# Patient Record
Sex: Male | Born: 1970
Health system: Southern US, Community
[De-identification: ages and names within clinical notes are randomized; demographics above are authoritative.]

## PROBLEM LIST (undated history)

## (undated) DIAGNOSIS — I10 Essential (primary) hypertension: Secondary | ICD-10-CM

## (undated) DIAGNOSIS — K219 Gastro-esophageal reflux disease without esophagitis: Secondary | ICD-10-CM

---

## 2000-04-24 ENCOUNTER — Ambulatory Visit (HOSPITAL_COMMUNITY): Admission: RE | Admit: 2000-04-24 | Discharge: 2000-04-24 | Payer: Self-pay | Admitting: Neurosurgery

## 2000-04-24 ENCOUNTER — Encounter: Payer: Self-pay | Admitting: Neurosurgery

## 2006-03-04 ENCOUNTER — Ambulatory Visit (HOSPITAL_COMMUNITY): Admission: RE | Admit: 2006-03-04 | Discharge: 2006-03-04 | Payer: Self-pay | Admitting: Neurosurgery

## 2006-04-08 ENCOUNTER — Encounter: Admission: RE | Admit: 2006-04-08 | Discharge: 2006-04-08 | Payer: Self-pay | Admitting: Neurosurgery

## 2006-05-20 ENCOUNTER — Encounter: Admission: RE | Admit: 2006-05-20 | Discharge: 2006-05-20 | Payer: Self-pay | Admitting: Neurosurgery

## 2006-07-19 ENCOUNTER — Encounter: Admission: RE | Admit: 2006-07-19 | Discharge: 2006-07-19 | Payer: Self-pay | Admitting: Neurosurgery

## 2007-03-17 IMAGING — CR DG CERVICAL SPINE 1V
1 series · 1 of 1 positions shown · non-contrast
Comparison: 03/04/06.

CLINICAL DATA: Anterior fusion.  Neck pain. 
 CERVICAL SPINE ONE VIEW:

[w c-spine lat]
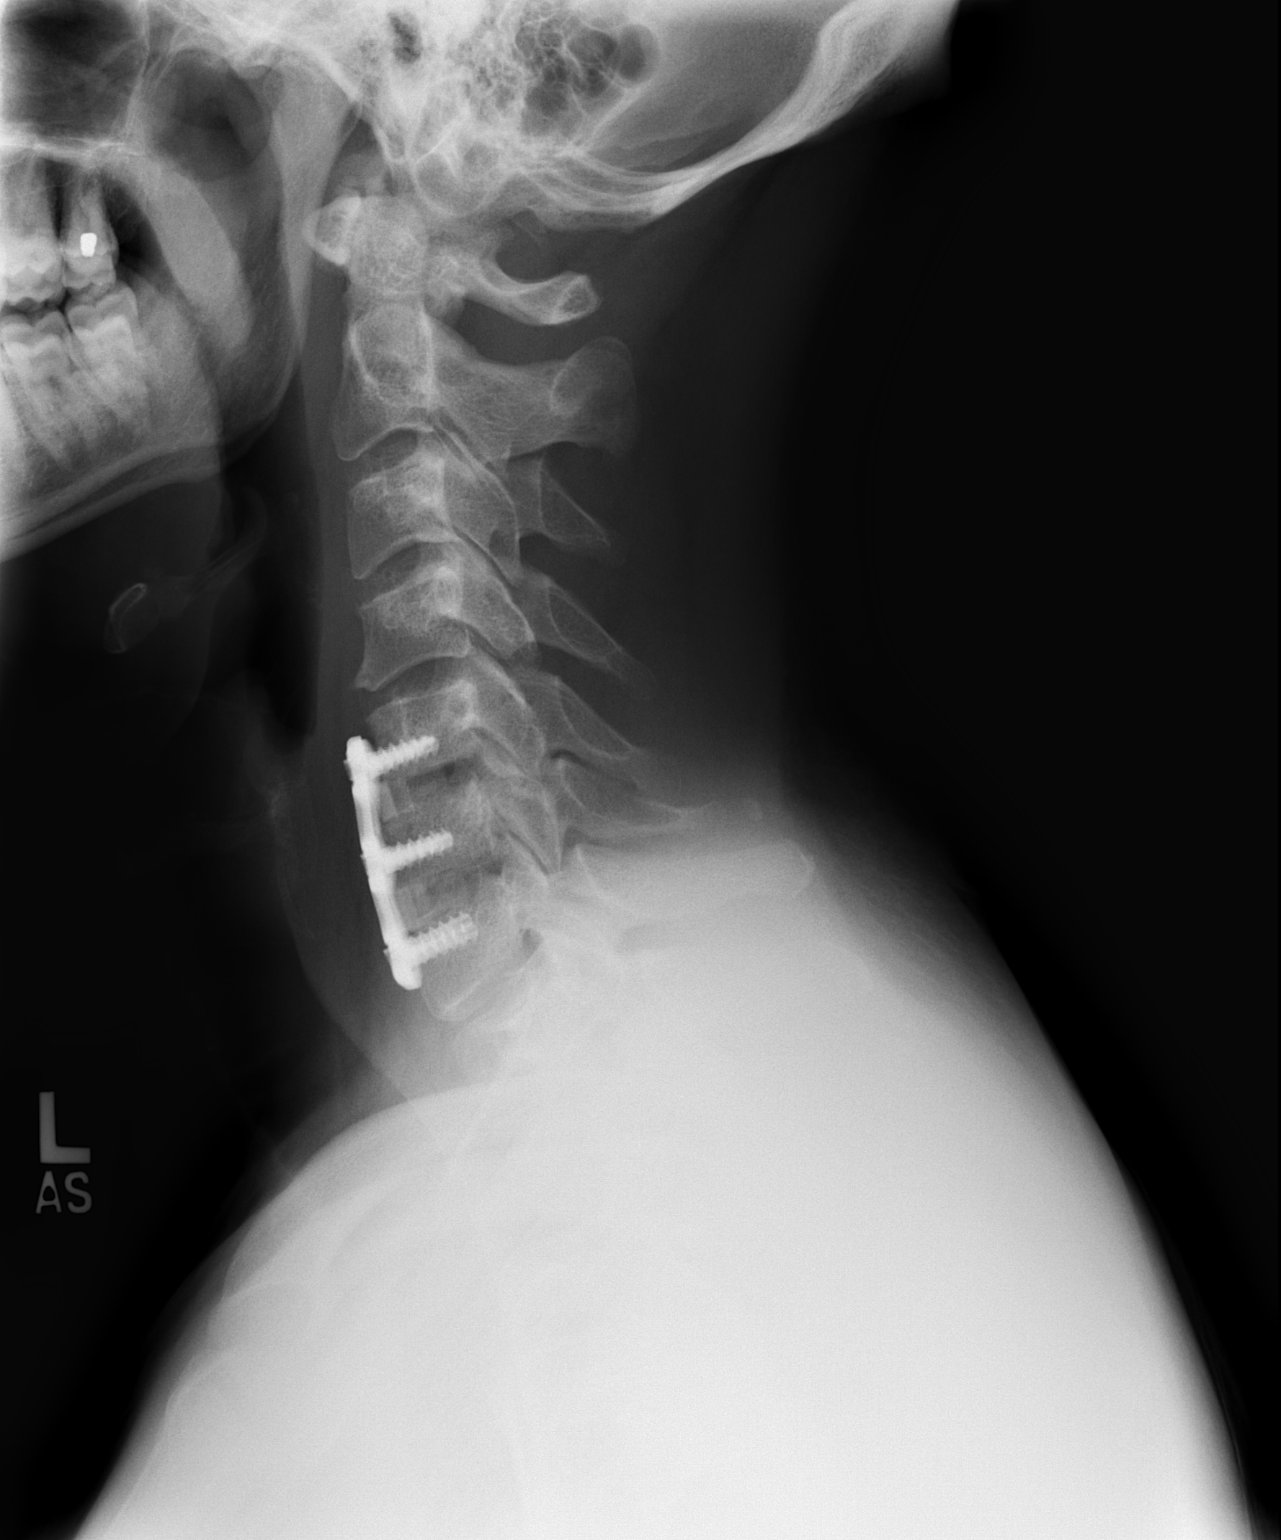

[1 of 1 positions shown; findings below may reference images not displayed]

Lateral view of the cervical spine is compared to an intraoperative film.  Anterior fusion appears stable at C5-6 and C6-7 levels.  The interbody fusion plugs are unchanged in position and in height and the anterior metallic fixation plate remains with normal alignment present.  No significant prevertebral soft tissue swelling is seen.
IMPRESSION: Stable appearance of anterior fusion at C5-6 and C6-7.

## 2007-04-28 IMAGING — CR DG CERVICAL SPINE 1V
1 series · 1 of 1 positions shown · non-contrast
Comparison: 04/08/06.

CLINICAL DATA: Cervical fusion Sunday October, 2005.  Some soreness in shoulders. 
 CERVICAL SPINE ONE VIEW:

[view not recorded]
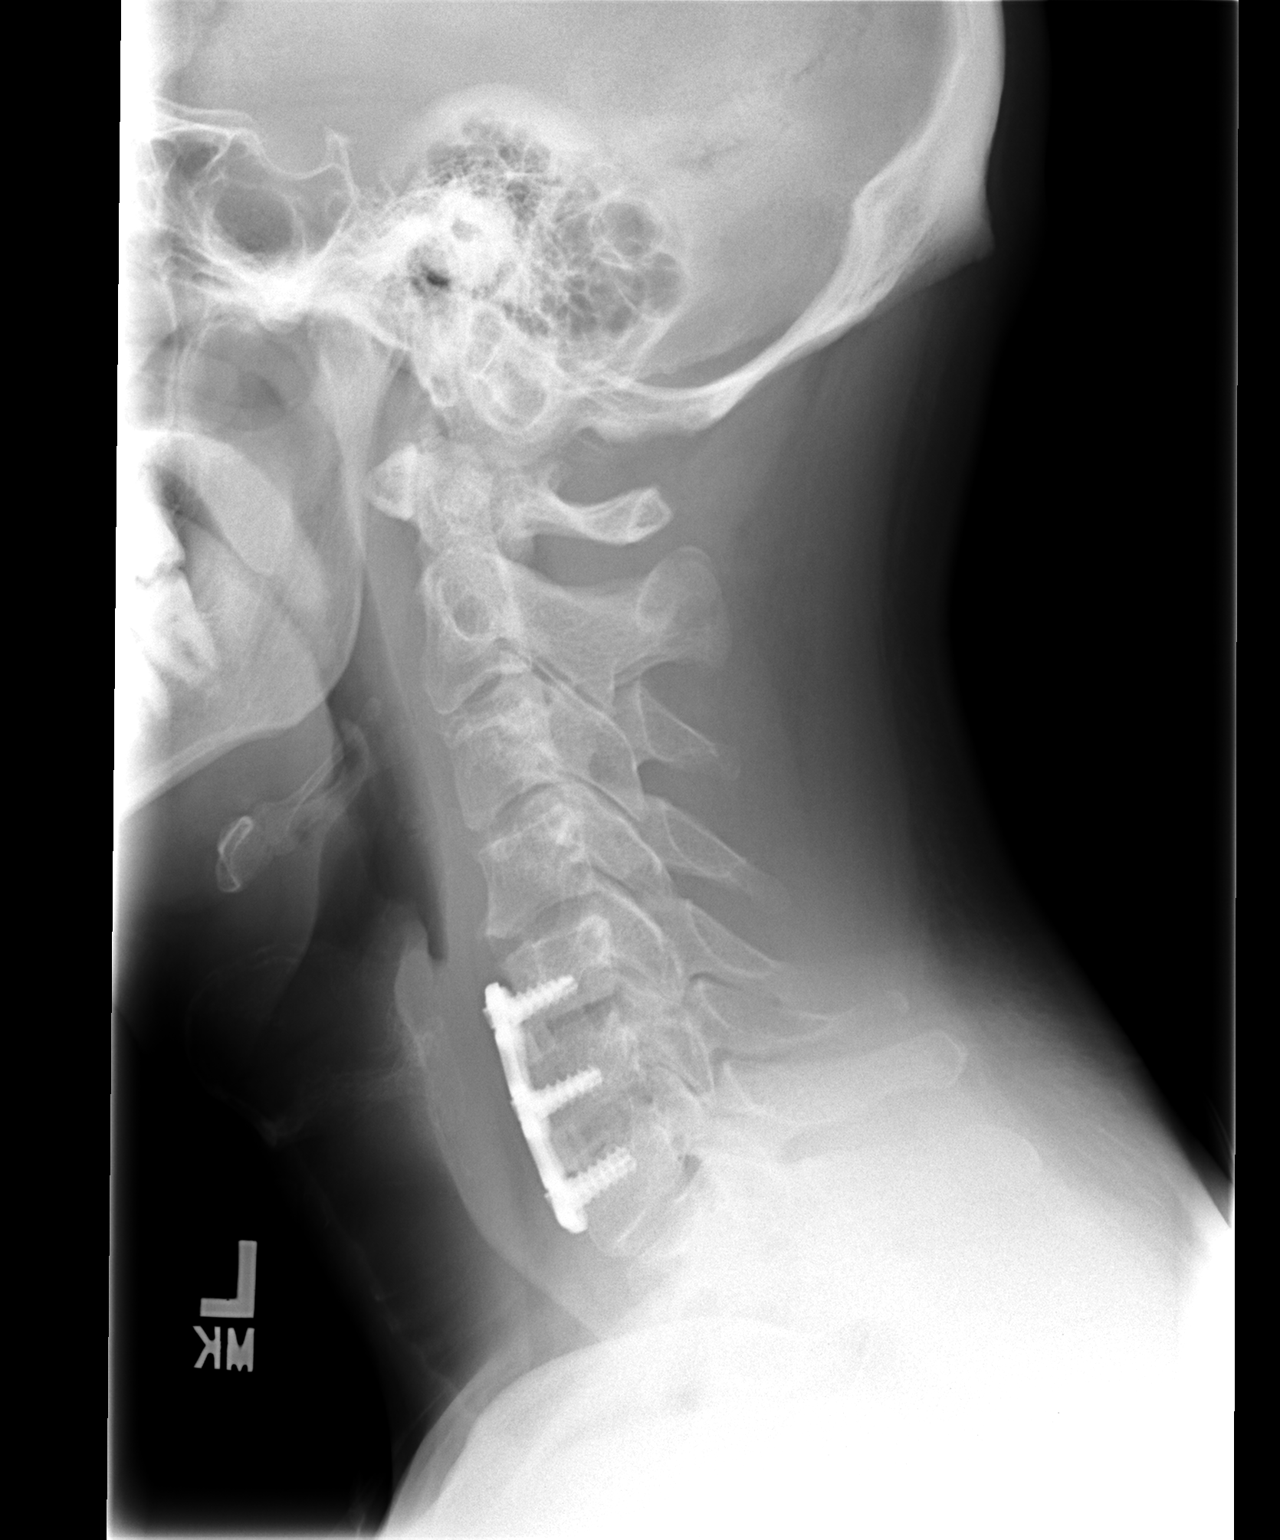

[1 of 1 positions shown; findings below may reference images not displayed]

A single lateral view of the cervical spine shows stable anterior fusion at C5-6 and C6-7.  Interbody fusion plugs are unchanged in position and in height and the anterior metallic fixation plate is unchanged.  No change in alignment is seen.
IMPRESSION: Stable anterior fusion at C5-6 and C6-7.

## 2014-12-29 ENCOUNTER — Other Ambulatory Visit: Payer: Self-pay | Admitting: Family Medicine

## 2014-12-30 ENCOUNTER — Other Ambulatory Visit: Payer: Self-pay | Admitting: Family Medicine

## 2014-12-30 DIAGNOSIS — R1310 Dysphagia, unspecified: Secondary | ICD-10-CM

## 2015-01-05 ENCOUNTER — Ambulatory Visit
Admission: RE | Admit: 2015-01-05 | Discharge: 2015-01-05 | Disposition: A | Discharge status: Home / Self Care | Payer: BLUE CROSS/BLUE SHIELD | Source: Ambulatory Visit | Attending: Family Medicine | Admitting: Family Medicine

## 2015-01-05 DIAGNOSIS — R1310 Dysphagia, unspecified: Secondary | ICD-10-CM

## 2015-07-20 DIAGNOSIS — I10 Essential (primary) hypertension: Secondary | ICD-10-CM | POA: Diagnosis not present

## 2016-01-07 ENCOUNTER — Ambulatory Visit (HOSPITAL_COMMUNITY)
Admission: EM | Admit: 2016-01-07 | Discharge: 2016-01-07 | Disposition: A | Discharge status: Home / Self Care | Payer: BLUE CROSS/BLUE SHIELD | Attending: Emergency Medicine | Admitting: Emergency Medicine

## 2016-01-07 ENCOUNTER — Encounter (HOSPITAL_COMMUNITY): Payer: Self-pay | Admitting: Emergency Medicine

## 2016-01-07 ENCOUNTER — Encounter (HOSPITAL_COMMUNITY)
Admission: EM | Disposition: A | Discharge status: Home / Self Care | Payer: Self-pay | Source: Home / Self Care | Attending: Emergency Medicine

## 2016-01-07 DIAGNOSIS — I10 Essential (primary) hypertension: Secondary | ICD-10-CM | POA: Insufficient documentation

## 2016-01-07 DIAGNOSIS — K222 Esophageal obstruction: Secondary | ICD-10-CM | POA: Insufficient documentation

## 2016-01-07 DIAGNOSIS — T18128A Food in esophagus causing other injury, initial encounter: Secondary | ICD-10-CM

## 2016-01-07 HISTORY — PX: ESOPHAGOGASTRODUODENOSCOPY: SHX5428

## 2016-01-07 LAB — COMPREHENSIVE METABOLIC PANEL
ALBUMIN: 5 g/dL (ref 3.5–5.0)
ALK PHOS: 64 U/L (ref 38–126)
ALT: 37 U/L (ref 17–63)
AST: 43 U/L — ABNORMAL HIGH (ref 15–41)
Anion gap: 12 (ref 5–15)
BUN: 19 mg/dL (ref 6–20)
CALCIUM: 9.7 mg/dL (ref 8.9–10.3)
CO2: 23 mmol/L (ref 22–32)
CREATININE: 0.99 mg/dL (ref 0.61–1.24)
Chloride: 107 mmol/L (ref 101–111)
GFR calc Af Amer: >60 mL/min (ref >60)
GFR calc non Af Amer: >60 mL/min (ref >60)
GLUCOSE: 110 mg/dL — AB (ref 65–99)
Potassium: 3.7 mmol/L (ref 3.5–5.1)
SODIUM: 142 mmol/L (ref 135–145)
Total Bilirubin: 1.5 mg/dL — ABNORMAL HIGH (ref 0.3–1.2)
Total Protein: 8.3 g/dL — ABNORMAL HIGH (ref 6.5–8.1)

## 2016-01-07 LAB — CBC
HCT: 42.9 % (ref 39.0–52.0)
Hemoglobin: 14.2 g/dL (ref 13.0–17.0)
MCH: 31.1 pg (ref 26.0–34.0)
MCHC: 33.1 g/dL (ref 30.0–36.0)
MCV: 93.9 fL (ref 78.0–100.0)
PLATELETS: 197 10*3/uL (ref 150–400)
RBC: 4.57 MIL/uL (ref 4.22–5.81)
RDW: 13.1 % (ref 11.5–15.5)
WBC: 7 10*3/uL (ref 4.0–10.5)

## 2016-01-07 LAB — URINALYSIS, ROUTINE W REFLEX MICROSCOPIC
Glucose, UA: NEGATIVE mg/dL
HGB URINE DIPSTICK: NEGATIVE
Ketones, ur: 15 mg/dL — AB
Leukocytes, UA: NEGATIVE
Nitrite: NEGATIVE
PROTEIN: 30 mg/dL — AB
SPECIFIC GRAVITY, URINE: 1.036 — AB (ref 1.005–1.030)
pH: 6.5 (ref 5.0–8.0)

## 2016-01-07 LAB — URINE MICROSCOPIC-ADD ON: SQUAMOUS EPITHELIAL / LPF: NONE SEEN

## 2016-01-07 LAB — LIPASE, BLOOD: Lipase: 28 U/L (ref 11–51)

## 2016-01-07 SURGERY — EGD (ESOPHAGOGASTRODUODENOSCOPY)
Anesthesia: Moderate Sedation

## 2016-01-07 MED ORDER — FENTANYL CITRATE (PF) 100 MCG/2ML IJ SOLN
INTRAMUSCULAR | Status: DC | PRN
  Administered 2016-01-07 (×3): 25 ug via INTRAVENOUS

## 2016-01-07 MED ORDER — BUTAMBEN-TETRACAINE-BENZOCAINE 2-2-14 % EX AERO
INHALATION_SPRAY | CUTANEOUS | Status: DC | PRN
  Administered 2016-01-07: 2 via TOPICAL

## 2016-01-07 MED ORDER — DIPHENHYDRAMINE HCL 50 MG/ML IJ SOLN
INTRAMUSCULAR | Status: AC
  Filled 2016-01-07: qty 1

## 2016-01-07 MED ORDER — FENTANYL CITRATE (PF) 100 MCG/2ML IJ SOLN
INTRAMUSCULAR | Status: AC
  Filled 2016-01-07: qty 4

## 2016-01-07 MED ORDER — MIDAZOLAM HCL 5 MG/ML IJ SOLN
INTRAMUSCULAR | Status: AC
  Filled 2016-01-07: qty 2

## 2016-01-07 MED ORDER — MIDAZOLAM HCL 10 MG/2ML IJ SOLN
INTRAMUSCULAR | Status: DC | PRN
  Administered 2016-01-07: 1 mg via INTRAVENOUS
  Administered 2016-01-07 (×3): 2 mg via INTRAVENOUS

## 2016-01-07 MED ORDER — DIPHENHYDRAMINE HCL 50 MG/ML IJ SOLN
INTRAMUSCULAR | Status: DC | PRN
  Administered 2016-01-07 (×2): 25 mg via INTRAVENOUS

## 2016-01-07 MED ORDER — ONDANSETRON HCL 4 MG/2ML IJ SOLN
4.0000 mg | Freq: Once | INTRAMUSCULAR | Status: AC | PRN
  Administered 2016-01-07: 4 mg via INTRAVENOUS
  Filled 2016-01-07: qty 2

## 2016-01-07 NOTE — ED Provider Notes (Signed)
[facility]-EMERGENCY DEPT Provider Note   CSN: [id number] Arrival date & time: 01/07/16  1240     History   Chief Complaint Chief Complaint  Patient presents with  . Emesis  . Sore Throat    HPI Dakota Fields is a 45 y.o. male.  Patient is a 45 year old male with history of hypertension who presents the ED with complaint of vomiting. Patient reports after eating dinner last night he began to feel that something was stuck in his throat. He reports eating an eggroll with spicy sauce and dry chicken. He notes he started vomiting around 10 PM last night. He states he is able to vomit a small amount of food but states he still feels that something is stuck in his throat. He notes he has been unable to tolerate PO since onset. He reports his throat feels sore from vomiting and notes he has a mild headache yesterday which has since resolved. Pt states he will belch intermittently which will mildly relieved his discomfort but reports it then returned and he feels like he needs to vomit but is unable to. Denies fever, chills, difficulty breathing, chest pain, hematemesis, abdominal pain, nausea. Denies history of similar symptoms.      History reviewed. No pertinent past medical history.  There are no active problems to display for this patient.   History reviewed. No pertinent surgical history.     Home Medications    Prior to Admission medications   Medication Sig Start Date End Date Taking? Authorizing Provider  lisinopril (PRINIVIL,ZESTRIL) 20 MG tablet Take 20 mg by mouth daily.   Yes Historical Provider, MD  loratadine (CLARITIN) 10 MG tablet Take 10 mg by mouth daily.   Yes Historical Provider, MD  metoprolol succinate (TOPROL-XL) 25 MG 24 hr tablet Take 25 mg by mouth daily.   Yes Historical Provider, MD  Multiple Vitamin (MULTIVITAMIN WITH MINERALS) TABS tablet Take 1 tablet by mouth daily.   Yes Historical Provider, MD  omega-3 acid ethyl esters (LOVAZA) 1 g capsule Take 1  g by mouth 2 (two) times daily.   Yes Historical Provider, MD    Family History History reviewed. No pertinent family history.  Social History Social History  Substance Use Topics  . Smoking status: Never Smoker  . Smokeless tobacco: Not on file  . Alcohol use No     Comment: 3-4 times per week     Allergies   Review of patient's allergies indicates no known allergies.   Review of Systems Review of Systems  HENT: Positive for sore throat and trouble swallowing.   Gastrointestinal: Positive for vomiting.  Neurological: Positive for headaches.  All other systems reviewed and are negative.    Physical Exam Updated Vital Signs BP (!) 164/107 (BP Location: Right Arm)   Pulse 101   Temp 98.4 F (36.9 C) (Oral)   Resp 16   SpO2 100%   Physical Exam  Constitutional: He is oriented to person, place, and time. He appears well-developed and well-nourished. No distress.  Pt dry heaving and spitting into a emesis bag throughout exam.   HENT:  Head: Normocephalic and atraumatic.  Mouth/Throat: Uvula is midline, oropharynx is clear and moist and mucous membranes are normal. No oropharyngeal exudate, posterior oropharyngeal edema, posterior oropharyngeal erythema or tonsillar abscesses. No tonsillar exudate.  Eyes: Conjunctivae and EOM are normal. Right eye exhibits no discharge. Left eye exhibits no discharge. No scleral icterus.  Neck: Normal range of motion. Neck supple.  Cardiovascular: Normal rate, regular  rhythm, normal heart sounds and intact distal pulses.   Pulmonary/Chest: Effort normal and breath sounds normal. No respiratory distress. He has no wheezes. He has no rales. He exhibits no tenderness.  Abdominal: Soft. Bowel sounds are normal. He exhibits no distension and no mass. There is no tenderness. There is no rebound and no guarding.  Musculoskeletal: He exhibits no edema.  Neurological: He is alert and oriented to person, place, and time.  Skin: Skin is warm and  dry. He is not diaphoretic.  Nursing note and vitals reviewed.    ED Treatments / Results  Labs (all labs ordered are listed, but only abnormal results are displayed) Labs Reviewed  COMPREHENSIVE METABOLIC PANEL - Abnormal; Notable for the following:       Result Value   Glucose, Bld 110 (*)    Total Protein 8.3 (*)    AST 43 (*)    Total Bilirubin 1.5 (*)    All other components within normal limits  URINALYSIS, ROUTINE W REFLEX MICROSCOPIC (NOT AT [facility]) - Abnormal; Notable for the following:    Color, Urine AMBER (*)    Specific Gravity, Urine 1.036 (*)    Bilirubin Urine SMALL (*)    Ketones, ur 15 (*)    Protein, ur 30 (*)    All other components within normal limits  URINE MICROSCOPIC-ADD ON - Abnormal; Notable for the following:    Bacteria, UA RARE (*)    All other components within normal limits  LIPASE, BLOOD  CBC    EKG  EKG Interpretation None       Radiology No results found.  Procedures Procedures (including critical care time)  Medications Ordered in ED Medications  ondansetron (ZOFRAN) injection 4 mg (4 mg Intravenous Given 01/07/16 1500)     Initial Impression / Assessment and Plan / ED Course  I have reviewed the triage vital signs and the nursing notes.  Pertinent labs & imaging results that were available during my care of the patient were reviewed by me and considered in my medical decision making (see chart for details).  Clinical Course    Patient presents with vomiting. He reports after eating dinner last night he felt that food was stuck in his throat and notes he has not been able to tolerate PO since onset. VSS. On exam patient does not appear to be in distress. Lungs CTAB. Pt spitting into emesis bag, unable to tolerate secretions/PO. Consulted GI due to concern for food impaction. Dr. Madilyn FiremanHayes reports he will come evaluate the pt in the ED. Plan to perform endoscopy and d/c home s/p procedure.   Final Clinical Impressions(s) / ED  Diagnoses   Final diagnoses:  Food impaction of esophagus, initial encounter    New Prescriptions New Prescriptions   No medications on file     Barrett Henleicole Elizabeth Jaunice Mirza, PA-C 01/07/16 1633    Canary Brimhristopher J Tegeler, MD 01/08/16 1039

## 2016-01-07 NOTE — Op Note (Signed)
[facility] Patient Name: Dakota LacyChadwick Gaetz Procedure Date: 01/07/2016 MRN: [id number] Attending MD: Barrie FolkJohn C Jeralynn Vaquera , MD Date of Birth: [birth year] CSN: [id number] Age: [age] Admit Type: Outpatient Procedure:                Upper GI endoscopy Indications:              Foreign body in the esophagus Providers:                Everardo AllJohn C. Madilyn FiremanHayes, MD, Tomma RakersJennifer Kappus, RN, Oletha Blendavida                            Shoffner, Technician Referring MD:              Medicines:                Midazolam 7 mg IV, Fentanyl 75 micrograms IV,                            Diphenhydramine 25 mg IV Complications:            No immediate complications. Estimated Blood Loss:     Estimated blood loss: none. Procedure:                Pre-Anesthesia Assessment:                           - Prior to the procedure, a History and Physical                            was performed, and patient medications and                            allergies were reviewed. The patient's tolerance of                            previous anesthesia was also reviewed. The risks                            and benefits of the procedure and the sedation                            options and risks were discussed with the patient.                            All questions were answered, and informed consent                            was obtained. Prior Anticoagulants: The patient has                            taken no previous anticoagulant or antiplatelet                            agents. ASA Grade Assessment: II - A patient with  mild systemic disease. After reviewing the risks                            and benefits, the patient was deemed in                            satisfactory condition to undergo the procedure.                           After obtaining informed consent, the endoscope was                            passed under direct vision. Throughout the                            procedure, the  patient's blood pressure, pulse, and                            oxygen saturations were monitored continuously. The                            [id number]) scope was introduced through the                            mouth, and advanced to the second part of duodenum.                            The upper GI endoscopy was accomplished without                            difficulty. The patient tolerated the procedure                            well. Findings:      Food was found in the distal esophagus. Removal of food was accomplished.      A low-grade of narrowing Schatzki ring (acquired) was found at the       gastroesophageal junction.      The exam was otherwise without abnormality. Impression:               - Food in the distal esophagus. Removal was                            successful.                           - Low-grade of narrowing Schatzki ring.                           - The examination was otherwise normal. Moderate Sedation:      Moderate (conscious) sedation was administered by the endoscopy nurse       and supervised by the endoscopist. The following parameters were       monitored: oxygen saturation, heart rate, blood pressure, and response       to care. [Procedure Duration Time]. Recommendation:           -  Discharge patient to home (ambulatory).                           - Use Prilosec OTC 20 mg PO daily.                           - Return to my office in 4 weeks.                           - Soft diet.                           - Continue present medications. Procedure Code(s):        --- Professional ---                           [phone], Esophagogastroduodenoscopy, flexible,                            transoral; with removal of foreign body(s) Diagnosis Code(s):        --- Professional ---                           V78.469G, Food in esophagus causing other injury,                            initial encounter                           K22.2, Esophageal  obstruction                           T18.108A, Unspecified foreign body in esophagus                            causing other injury, initial encounter CPT copyright 2016 American Medical Association. All rights reserved. The codes documented in this report are preliminary and upon coder review may  be revised to meet current compliance requirements. Barrie Folk, MD 01/07/2016 5:31:36 PM This report has been signed electronically. Number of Addenda: 0

## 2016-01-07 NOTE — Discharge Instructions (Signed)
YOU HAD AN ENDOSCOPIC PROCEDURE TODAY: Refer to the procedure report and other information in the discharge instructions given to you for any specific questions about what was found during the examination. If this information does not answer your questions, please call [facility] office at [phone] to clarify.   YOU SHOULD EXPECT: Some feelings of bloating in the abdomen. Passage of more gas than usual. Walking can help get rid of the air that was put into your GI tract during the procedure and reduce the bloating. If you had a lower endoscopy (such as a colonoscopy or flexible sigmoidoscopy) you may notice spotting of blood in your stool or on the toilet paper. Some abdominal soreness may be present for a day or two, also.  DIET: Your first meal following the procedure should be a light meal and then it is ok to progress to your normal diet. A half-sandwich or bowl of soup is an example of a good first meal. Heavy or fried foods are harder to digest and may make you feel nauseous or bloated. Drink plenty of fluids but you should avoid alcoholic beverages for 24 hours. If you had a esophageal dilation, please see attached instructions for diet.   ACTIVITY: Your care partner should take you home directly after the procedure. You should plan to take it easy, moving slowly for the rest of the day. You can resume normal activity the day after the procedure however YOU SHOULD NOT DRIVE, use power tools, machinery or perform tasks that involve climbing or major physical exertion for 24 hours (because of the sedation medicines used during the test).   SYMPTOMS TO REPORT IMMEDIATELY: A gastroenterologist can be reached at any hour. Please call [phone]  for any of the following symptoms:  Following lower endoscopy (colonoscopy, flexible sigmoidoscopy) Excessive amounts of blood in the stool  Significant tenderness, worsening of abdominal pains  Swelling of the abdomen that is new, acute  Fever of 100 or  higher  Following upper endoscopy (EGD, EUS, ERCP, esophageal dilation) Vomiting of blood or coffee ground material  New, significant abdominal pain  New, significant chest pain or pain under the shoulder blades  Painful or persistently difficult swallowing  New shortness of breath  Black, tarry-looking or red, bloody stools  FOLLOW UP:  If any biopsies were taken you will be contacted by phone or by letter within the next 1-3 weeks. Call [phone]  if you have not heard about the biopsies in 3 weeks.  Please also call with any specific questions about appointments or follow up tests. Moderate Conscious Sedation, Adult, Care After Refer to this sheet in the next few weeks. These instructions provide you with information on caring for yourself after your procedure. Your health care provider may also give you more specific instructions. Your treatment has been planned according to current medical practices, but problems sometimes occur. Call your health care provider if you have any problems or questions after your procedure. WHAT TO EXPECT AFTER THE PROCEDURE  After your procedure:  You may feel sleepy, clumsy, and have poor balance for several hours.  Vomiting may occur if you eat too soon after the procedure. HOME CARE INSTRUCTIONS  Do not participate in any activities where you could become injured for at least 24 hours. Do not:  Drive.  Swim.  Ride a bicycle.  Operate heavy machinery.  Cook.  Use power tools.  Climb ladders.  Work from a high place.  Do not make important decisions or sign legal  documents until you are improved.  If you vomit, drink water, juice, or soup when you can drink without vomiting. Make sure you have little or no nausea before eating solid foods.  Only take over-the-counter or prescription medicines for pain, discomfort, or fever as directed by your health care provider.  Make sure you and your family fully understand everything about the  medicines given to you, including what side effects may occur.  You should not drink alcohol, take sleeping pills, or take medicines that cause drowsiness for at least 24 hours.  If you smoke, do not smoke without supervision.  If you are feeling better, you may resume normal activities 24 hours after you were sedated.  Keep all appointments with your health care provider. SEEK MEDICAL CARE IF:  Your skin is pale or bluish in color.  You continue to feel nauseous or vomit.  Your pain is getting worse and is not helped by medicine.  You have bleeding or swelling.  You are still sleepy or feeling clumsy after 24 hours. SEEK IMMEDIATE MEDICAL CARE IF:  You develop a rash.  You have difficulty breathing.  You develop any type of allergic problem.  You have a fever. MAKE SURE YOU:  Understand these instructions.  Will watch your condition.  Will get help right away if you are not doing well or get worse.   This information is not intended to replace advice given to you by your health care provider. Make sure you discuss any questions you have with your health care provider.   Document Released: 01/06/2013 Document Revised: 04/08/2014 Document Reviewed: 01/06/2013 Elsevier Interactive Patient Education [employer].

## 2016-01-07 NOTE — H&P (Signed)
[facility] Admission History & Physical  Chief Complaint: Food stuck in esophagus HPI: Dakota Fields is an 45 y.o. white male.  With history of intermittent solid food dysphagia 8 several bites of chicken last night is felt that he could not clear the bolus beyond his esophagus. He's been name and able to hold down liquids every since. He has had similar milder episodes but never requiring treatment. He denies chest pain is breathing fine.  History reviewed. No pertinent past medical history.  History reviewed. No pertinent surgical history.  Medications Prior to Admission  Medication Sig Dispense Refill  . lisinopril (PRINIVIL,ZESTRIL) 20 MG tablet Take 20 mg by mouth daily.    Marland Kitchen loratadine (CLARITIN) 10 MG tablet Take 10 mg by mouth daily.    . metoprolol succinate (TOPROL-XL) 25 MG 24 hr tablet Take 25 mg by mouth daily.    . Multiple Vitamin (MULTIVITAMIN WITH MINERALS) TABS tablet Take 1 tablet by mouth daily.    Marland Kitchen omega-3 acid ethyl esters (LOVAZA) 1 g capsule Take 1 g by mouth 2 (two) times daily.      Allergies: No Known Allergies  History reviewed. No pertinent family history.  Social History:  reports that he has never smoked. He does not have any smokeless tobacco history on file. He reports that he does not drink alcohol or use drugs.  Review of Systems: negative except As above   Blood pressure (!) 165/102, pulse 97, temperature 98.4 F (36.9 C), temperature source Oral, resp. rate (!) 28, height _0  (1.803 m), weight 84.8 kg (187 lb), SpO2 100 %. Head: Normocephalic, without obvious abnormality, atraumatic Neck: no adenopathy, no carotid bruit, no JVD, supple, symmetrical, trachea midline and thyroid not enlarged, symmetric, no tenderness/mass/nodules Resp: clear to auscultation bilaterally Cardio: regular rate and rhythm, S1, S2 normal, no murmur, click, rub or gallop GI: Abdomen soft nondistended with normoactive bowel sounds. No Megaly Masses  or Guarding. Extremities: extremities normal, atraumatic, no cyanosis or edema  Results for orders placed or performed during the hospital encounter of 01/07/16 (from the past 48 hour(s))  Lipase, blood     Status: None   Collection Time: 01/07/16  1:01 PM  Result Value Ref Range   Lipase 28 11 - 51 U/L  Comprehensive metabolic panel     Status: Abnormal   Collection Time: 01/07/16  1:01 PM  Result Value Ref Range   Sodium 142 135 - 145 mmol/L   Potassium 3.7 3.5 - 5.1 mmol/L   Chloride 107 101 - 111 mmol/L   CO2 23 22 - 32 mmol/L   Glucose, Bld 110 (H) 65 - 99 mg/dL   BUN 19 6 - 20 mg/dL   Creatinine, Ser 0.99 0.61 - 1.24 mg/dL   Calcium 9.7 8.9 - 10.3 mg/dL   Total Protein 8.3 (H) 6.5 - 8.1 g/dL   Albumin 5.0 3.5 - 5.0 g/dL   AST 43 (H) 15 - 41 U/L   ALT 37 17 - 63 U/L   Alkaline Phosphatase 64 38 - 126 U/L   Total Bilirubin 1.5 (H) 0.3 - 1.2 mg/dL   GFR calc non Af Amer >60 >60 mL/min   GFR calc Af Amer >60 >60 mL/min    Comment: (NOTE) The eGFR has been calculated using the CKD EPI equation. This calculation has not been validated in all clinical situations. eGFR's persistently <60 mL/min signify possible Chronic Kidney Disease.    Anion gap 12 5 - 15  CBC  Status: None   Collection Time: 01/07/16  1:01 PM  Result Value Ref Range   WBC 7.0 4.0 - 10.5 K/uL   RBC 4.57 4.22 - 5.81 MIL/uL   Hemoglobin 14.2 13.0 - 17.0 g/dL   HCT 42.9 39.0 - 52.0 %   MCV 93.9 78.0 - 100.0 fL   MCH 31.1 26.0 - 34.0 pg   MCHC 33.1 30.0 - 36.0 g/dL   RDW 13.1 11.5 - 15.5 %   Platelets 197 150 - 400 K/uL  Urinalysis, Routine w reflex microscopic     Status: Abnormal   Collection Time: 01/07/16  2:30 PM  Result Value Ref Range   Color, Urine AMBER (A) YELLOW    Comment: BIOCHEMICALS MAY BE AFFECTED BY COLOR   APPearance CLEAR CLEAR   Specific Gravity, Urine 1.036 (H) 1.005 - 1.030   pH 6.5 5.0 - 8.0   Glucose, UA NEGATIVE NEGATIVE mg/dL   Hgb urine dipstick NEGATIVE NEGATIVE    Bilirubin Urine SMALL (A) NEGATIVE   Ketones, ur 15 (A) NEGATIVE mg/dL   Protein, ur 30 (A) NEGATIVE mg/dL   Nitrite NEGATIVE NEGATIVE   Leukocytes, UA NEGATIVE NEGATIVE  Urine microscopic-add on     Status: Abnormal   Collection Time: 01/07/16  2:30 PM  Result Value Ref Range   Squamous Epithelial / LPF NONE SEEN NONE SEEN   WBC, UA 0-5 0 - 5 WBC/hpf   RBC / HPF 0-5 0 - 5 RBC/hpf   Bacteria, UA RARE (A) NONE SEEN   Urine-Other MUCOUS PRESENT    No results found.  Assessment: Esophageal food impaction, unable to clear spontaneously Plan: Will proceed with EGD and attempted disimpaction, risks including aspiration, perforation,  need for general anesthesia with intubation explained and he wishes to proceed  [place] Jenifer C 01/07/2016, 5:06 PM

## 2016-01-07 NOTE — ED Triage Notes (Addendum)
Pt c/o sore throat, migraine headache, dehydration, emesis x 7 hours, reports losing 5 pounds overnight, onset last night 2200. Unable to tolerate fluids.  Sore vomiting started before sore throat. Posterior pharynx injected, tonsils 1+.

## 2016-01-08 ENCOUNTER — Encounter (HOSPITAL_COMMUNITY): Payer: Self-pay | Admitting: Gastroenterology

## 2016-06-06 DIAGNOSIS — K222 Esophageal obstruction: Secondary | ICD-10-CM | POA: Diagnosis not present

## 2016-06-12 DIAGNOSIS — R1310 Dysphagia, unspecified: Secondary | ICD-10-CM | POA: Diagnosis not present

## 2016-06-12 DIAGNOSIS — K2981 Duodenitis with bleeding: Secondary | ICD-10-CM | POA: Diagnosis not present

## 2016-06-12 DIAGNOSIS — K2930 Chronic superficial gastritis without bleeding: Secondary | ICD-10-CM | POA: Diagnosis not present

## 2016-06-12 DIAGNOSIS — K200 Eosinophilic esophagitis: Secondary | ICD-10-CM | POA: Diagnosis not present

## 2016-06-12 DIAGNOSIS — K209 Esophagitis, unspecified: Secondary | ICD-10-CM | POA: Diagnosis not present

## 2016-06-19 DIAGNOSIS — K209 Esophagitis, unspecified: Secondary | ICD-10-CM | POA: Diagnosis not present

## 2016-06-19 DIAGNOSIS — K2930 Chronic superficial gastritis without bleeding: Secondary | ICD-10-CM | POA: Diagnosis not present

## 2016-06-19 DIAGNOSIS — K200 Eosinophilic esophagitis: Secondary | ICD-10-CM | POA: Diagnosis not present

## 2016-06-19 DIAGNOSIS — K2981 Duodenitis with bleeding: Secondary | ICD-10-CM | POA: Diagnosis not present

## 2016-08-01 DIAGNOSIS — I10 Essential (primary) hypertension: Secondary | ICD-10-CM | POA: Diagnosis not present

## 2016-11-15 DIAGNOSIS — M10071 Idiopathic gout, right ankle and foot: Secondary | ICD-10-CM | POA: Diagnosis not present

## 2016-11-15 DIAGNOSIS — Z6827 Body mass index (BMI) 27.0-27.9, adult: Secondary | ICD-10-CM | POA: Diagnosis not present

## 2016-11-15 DIAGNOSIS — S99921A Unspecified injury of right foot, initial encounter: Secondary | ICD-10-CM | POA: Diagnosis not present

## 2016-11-15 DIAGNOSIS — M79671 Pain in right foot: Secondary | ICD-10-CM | POA: Diagnosis not present

## 2016-11-15 DIAGNOSIS — S96911A Strain of unspecified muscle and tendon at ankle and foot level, right foot, initial encounter: Secondary | ICD-10-CM | POA: Diagnosis not present

## 2016-11-20 DIAGNOSIS — I10 Essential (primary) hypertension: Secondary | ICD-10-CM | POA: Diagnosis not present

## 2016-11-20 DIAGNOSIS — M10271 Drug-induced gout, right ankle and foot: Secondary | ICD-10-CM | POA: Diagnosis not present

## 2016-12-12 DIAGNOSIS — H1045 Other chronic allergic conjunctivitis: Secondary | ICD-10-CM | POA: Diagnosis not present

## 2017-01-02 DIAGNOSIS — I10 Essential (primary) hypertension: Secondary | ICD-10-CM | POA: Diagnosis not present

## 2017-01-02 DIAGNOSIS — M1A9XX0 Chronic gout, unspecified, without tophus (tophi): Secondary | ICD-10-CM | POA: Diagnosis not present

## 2017-01-02 DIAGNOSIS — K200 Eosinophilic esophagitis: Secondary | ICD-10-CM | POA: Diagnosis not present

## 2017-01-28 DIAGNOSIS — K200 Eosinophilic esophagitis: Secondary | ICD-10-CM | POA: Diagnosis not present

## 2017-03-05 DIAGNOSIS — M1A9XX0 Chronic gout, unspecified, without tophus (tophi): Secondary | ICD-10-CM | POA: Diagnosis not present

## 2017-03-12 ENCOUNTER — Ambulatory Visit: Payer: BLUE CROSS/BLUE SHIELD | Admitting: Allergy & Immunology

## 2017-03-12 ENCOUNTER — Encounter: Payer: Self-pay | Admitting: Allergy & Immunology

## 2017-03-12 VITALS — BP 126/84 | HR 78 | Temp 98.6°F | Ht 72.0 in | Wt 206.8 lb

## 2017-03-12 DIAGNOSIS — J310 Chronic rhinitis: Secondary | ICD-10-CM | POA: Diagnosis not present

## 2017-03-12 DIAGNOSIS — K200 Eosinophilic esophagitis: Secondary | ICD-10-CM | POA: Diagnosis not present

## 2017-03-12 NOTE — Patient Instructions (Addendum)
1. Eosinophilic esophagitis - Testing today showed: very mild reaction to hazelnuts, otherwise negative to the rest of the panel. - There is a the low positive predictive value of food allergy testing and hence the high possibility of false positives. - In contrast, food allergy testing has a high negative predictive value, therefore if testing is negative we can be relatively assured that they are indeed negative.  - Negative items tested today included: Peanut, Soy, Wheat, Corn, Milk, Egg, Casein, Shellfish Mix, Fish Mix, Cashew, Tomato, Orange, Rice, Reliant EnergyPork, Malawiurkey, UvaldeBeef, [place] Od[place]Lamb, Chicken, White Potato, Banana, Apple, Peach, Sweet Potato, Green Pea, Strawberry, Onion, Cabbage, Carrots, Celery, Karaya Gum, Acacia (Arabic Gum), Cottonseed, Flounder, Trout, Shrimp, Yuleerab, Oil [place]una, [place]antaloupe, BushWatermelon, PyotePecan, DelightWalnut, Bu[place] Vistahocolate, PorterGrape, Cucumber, [address]Saccharomycese Cerevisiae, HammondOyster, BelgreenLobster, Kelle[place] IslandScallop, AndrewsSalmon, YaleAlmond, EstoniaBrazil nut, [place]ut, Cinnamon, Nutmeg, Ginger, Garlic, Black Pepper, Mustard, Barley, Oat, Rye, Sesame and Pineapple - Try avoid tree nuts and peanuts in your diet to see if this helps your symptoms. - Peanuts and tree nuts are often processed today, therefore I would avoid all of them.  - If this does not help, you might consider doing a six food elimination diet (milk, soy, eggs, wheat, peanuts/tree nuts, and seafood), or at least a trial elimination of milk. - Continue to follow up with Dr. Charna ElizabethJyothi Mann for further care. - We will get her records so that we can all be on the same page.   2. Chronic rhinitis - We will pursue environmental allergy testing in the future if you are interested. - In the meantime, we will continue with Zyrtec 10mg  daily.  3. Return in about 6 months (around 09/10/2017).   Please inform us of any Emergency Department visits, hospitalizations, or changes in symptoms. Call us before going to the ED for breathing or allergy symptoms since we might be able to fit you in for  a sick visit. Feel free to contact us anytime with any questions, problems, or concerns.  It was a pleasure to meet you today! Enjoy the holiday season!  Websites that have reliable patient information: 1. American Academy of Asthma, Allergy, and Immunology: www.aaaai.org 2. Food Allergy Research and Education (FARE): foodallergy.org 3. Mothers of Asthmatics: http://www.asthmacommunitynetwork.org 4. American College of Allergy, Asthma, and Immunology: www.acaai.org

## 2017-03-12 NOTE — Progress Notes (Signed)
NEW PATIENT  Date of Service/Encounter:  03/12/17  Referring provider: Shaune Pollack, MD   Assessment:   Eosinophilic esophagitis - with positive testing to hazelnuts today  Chronic rhinitis - controlled on cetirizine daily  Plan/Recommendations:   1. Eosinophilic esophagitis - with a history of food impaction - Testing today showed: very mild reaction to hazelnuts, otherwise negative to the rest of the panel. - There is a the low positive predictive value of food allergy testing and hence the high possibility of false positives. - In contrast, food allergy testing has a high negative predictive value, therefore if testing is negative we can be relatively assured that they are indeed negative.  - Negative items tested today included: Peanut, Soy, Wheat, Corn, Milk, Egg, Casein, Shellfish Mix, Fish Mix, Cashew, Tomato, Orange, Rice, Rel[employer] Energy, [place], [place], [place], Chicken, White Potato, Banana, Apple, Peach, Sweet Potato, Green Pea, Strawberry, Onion, Cabbage, Carrots, Celery, Karaya Gum, Acacia (Arabic Gum), Cottonseed, Flounder, Trout, Shrimp, [place], [place], [place], [place], [place], [place], [place], [place], Cucumber, [address], [place], [place], [place], [place], [place], [place] nut, [place], Cinnamon, Nutmeg, Ginger, Garlic, Black Pepper, Mustard, Barley, Oat, Rye, Sesame and Pineapple - Try avoid tree nuts and peanuts in your diet to see if this helps your symptoms. - Peanuts and tree nuts are often processed today, therefore I would avoid all of them.  - I also recommended continuing with Flovent one puff twice daily swallowed.  - If this does not help, you might consider doing a six food elimination diet (milk, soy, eggs, wheat, peanuts/tree nuts, and seafood), or at least a trial elimination of milk. - We had a long conversation about the diagnosis of EoE and informational handout was provided on the diagnosis. - I could not see any pathology results in the system,  therefore it is not clear whether this is confirmed EoE, but the symptoms certainly sound like it.  - Continue to follow up with Dr. Charna Elizabeth for further care. - We will get her records so that we can all be on the same page.   2. Chronic rhinitis - We will pursue environmental allergy testing in the future if you are interested. - In the meantime, we will continue with Zyrtec 10mg  daily. - It does not seem that he will require the use of allergen immunotherapy for symptom alleviation since antihistamines alone seem to control his symptoms adequately.   3. Return in about 6 months (around 09/10/2017).   Subjective:   Dakota Fields is a 46 y.o. male presenting today for evaluation of  Chief Complaint  Patient presents with  . Allergy Testing    Pt presents as a NP to have allergy testing done. Pt has had some issues with throat closing properly while eating.    Dakota Fields has a history of the following: There are no active problems to display for this patient.   History obtained from: chart review and patient.  Dakota Fields was referred by Shaune Pollack, MD.     Dakota Fields is a 46 y.o. male presenting for evaluation of allergies and asthma.   He started having throat issues recently. He went to see his PCP, where he was referred to Dr. Dorena Cookey. He underwent an endoscopy in October 2017, where a piece of chicken was removed from his esophagus. A narrowing of a mild Schatzki ring was also noted. It does not seem that biopsies were ever performed, although I might just be missing these in the chart today.  He has had a couple of other episodes - 6 in his lifetime - where he feels that food gets stuck in his throat. However, following the procedure Dr. Madilyn FiremanHayes went on leave. Dakota Fields was not given any guidance as to what to do, therefore he established care with Dr. Charna ElizabethJyothi Mann. He was then prescribed budesonide slurries. However, he did not want to pay the $88  required for these concoctions. He was then changed to Flovent swallowed, which he started around one month ago (unknown dose). Currently he is using two puffs swallowed once daily. He does think that this is helping with his symptoms. He has not changed his diet and restricted anything in the last month.   He does notice that meats (steak and chicken) can worsen his symptoms. He also notices that IPAs tend to make his symptoms worse. He does tolerate milk and eggs. He does have throat issues with peanut butter. He does eat shellfish as well as fin fish. He really does not restrict much of anything in his diet.  Dakota Fields does have a history of allergic rhinitis symptoms for a whole life time. He currently uses cetirizine 10mg  daily. This does control his symptoms without a problem. He does not current use a nasal spray. He has never been allergy tested. He denies postnasal drip and chronic sore throat. He has never considered allergy shots. He does have a dog, but does not think that he has increased problems with the dog.   Otherwise, there is no history of other atopic diseases, including asthma, drug allergies, stinging insect allergies, or urticaria. There is no significant infectious history. Vaccinations are up to date.    Past Medical History: There are no active problems to display for this patient.   Medication List:  Allergies as of 03/12/2017   No Known Allergies     Medication List        Accurate as of 03/12/17 12:16 PM. Always use your most recent med list.          allopurinol 100 MG tablet Commonly known as:  ZYLOPRIM Take 100 mg by mouth daily.   amLODipine-benazepril 5-10 MG capsule Commonly known as:  LOTREL Take 1 capsule by mouth daily.   lisinopril 20 MG tablet Commonly known as:  PRINIVIL,ZESTRIL Take 20 mg by mouth daily.   loratadine 10 MG tablet Commonly known as:  CLARITIN Take 10 mg by mouth daily.   metoprolol succinate 25 MG 24 hr  tablet Commonly known as:  TOPROL-XL Take 25 mg by mouth daily.   multivitamin with minerals Tabs tablet Take 1 tablet by mouth daily.   omega-3 acid ethyl esters 1 g capsule Commonly known as:  LOVAZA Take 1 g by mouth 2 (two) times daily.       Birth History: non-contributory.  Developmental History: non-contributory.   Past Surgical History: Past Surgical History:  Procedure Laterality Date  . ESOPHAGOGASTRODUODENOSCOPY N/A 01/07/2016   Procedure: ESOPHAGOGASTRODUODENOSCOPY (EGD);  Surgeon: Dorena CookeyJohn Hayes, MD;  [facility];  Service: Endoscopy;  Laterality: N/A;     Family History: Family History  Problem Relation Age of Onset  . Allergic rhinitis Neg Hx   . Angioedema Neg Hx   . Asthma Neg Hx   . Atopy Neg Hx   . Eczema Neg Hx      Social History: Dakota Fields lives at home in a home in a house that is 46 years old. There is carpeting throughout the home. They have electric heating and  central cooling. There is a dog inside of the home. There are no dust mite coverings on the bedding. There is no tobacco exposure. 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     Review of Systems: a 14-point review of systems is pertinent for what is mentioned in HPI.  Otherwise, all other systems were negative. Constitutional: negative other than that listed in the HPI Eyes: negative other than that listed in the HPI Ears, nose, mouth, throat, and face: negative other than that listed in the HPI Respiratory: negative other than that listed in the HPI Cardiovascular: negative other than that listed in the HPI Gastrointestinal: negative other than that listed in the HPI Genitourinary: negative other than that listed in the HPI Integument: negative other than that listed in the HPI Hematologic: negative other than that listed in the HPI Musculoskeletal: negative other than that listed in the HPI Neurological:  negative other than that listed in the HPI Allergy/Immunologic: negative other than that listed in the HPI    Objective:   Blood pressure 126/84, pulse 78, temperature 98.6 F (37 C), height 6' (1.829 m), weight 206 lb 12.8 oz (93.8 kg), SpO2 96 %. Body mass index is 28.05 kg/m.   Physical Exam:  General: Alert, interactive, in no acute distress. Pleasant male.  Eyes: No conjunctival injection bilaterally, no discharge on the right, no discharge on the left and no Horner-Trantas dots present. PERRL bilaterally. EOMI without pain. No photophobia.  Ears: Right TM pearly gray with normal light reflex, Left TM pearly gray with normal light reflex, Right TM intact without perforation and Left TM intact without perforation.  Nose/Throat: External nose within normal limits and septum midline. Turbinates edematous and pale with clear discharge. Posterior oropharynx erythematous without cobblestoning in the posterior oropharynx. Tonsils 2+ without exudates.  Tongue without thrush. Neck: Supple without thyromegaly. Trachea midline. Adenopathy: no enlarged lymph nodes appreciated in the anterior cervical, occipital, axillary, epitrochlear, inguinal, or popliteal regions. Lungs: Clear to auscultation without wheezing, rhonchi or rales. No increased work of breathing. CV: Normal S1/S2. No murmurs. Capillary refill <2 seconds.  Abdomen: Nondistended, nontender. No guarding or rebound tenderness. Bowel sounds present in all fields and hypoactive  Skin: Warm and dry, without lesions or rashes. Extremities:  No clubbing, cyanosis or edema. Neuro:   Grossly intact. No focal deficits appreciated. Responsive to questions.  Diagnostic studies:   Allergy Studies:   Full Food Panel: slightly reactive to hazelnut. Negative to Peanut, Soy, Wheat, Corn, Milk, Egg, Casein, Shellfish Mix, Fish Mix, Cashew, Tomato, Orange, Rice, Reliant EnergyPork, Malawiurkey, Mila DoceBeef, MomenceLamb, Chicken, [place]uebloWhite Potato, Banana, Apple, Peach, Sweet Potato,  Green Pea, Strawberry, Onion, Cabbage, Carrots, Celery, Karaya Gum, Acacia (Arabic Gum), Cottonseed, Flounder, Trout, Shrimp, [place] Colonyrab, [place]una, Bayardantaloupe, [place]Watermelon, McKeePecan, SummerhillWalnut, Eaglehocolate, Mel[place] ParkGrape, Cucumber, [address]Saccharomycese Cerevisiae, Santa ClaritaOyster, RockportLobster, JacksonvilleScallop, ImperialSalmon, BrittAlmond, EstoniaBrazil nut, Kensaloconut, Cinnamon, Nutmeg, Ginger, Garlic, Black Pepper, Mustard, Barley, Oat, Rye, Sesame and Pineapple.      Malachi BondsJoel Jenise Iannelli, MD [facility]

## 2017-03-28 DIAGNOSIS — H9312 Tinnitus, left ear: Secondary | ICD-10-CM | POA: Diagnosis not present

## 2017-04-21 ENCOUNTER — Other Ambulatory Visit: Payer: Self-pay | Admitting: Gastroenterology

## 2017-04-21 DIAGNOSIS — K200 Eosinophilic esophagitis: Secondary | ICD-10-CM | POA: Diagnosis not present

## 2017-06-04 ENCOUNTER — Encounter (HOSPITAL_COMMUNITY): Payer: Self-pay | Admitting: Emergency Medicine

## 2017-06-04 ENCOUNTER — Other Ambulatory Visit: Payer: Self-pay

## 2017-06-09 NOTE — H&P (Signed)
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 He continued to have dysphagia despite using Protonix twice a day and was advised to take budesonide 2 mg and was referrred to an allergy specialist, with plans to repeat EGD for biopsies to look at burden of eosinophils and dilate it needed. He had called on 04/03/17 stating that budesonide was expensive and was given the option of flovent 220mcg BID to be swallowed instead of inhalation. He has noted decrease in the frequency of clearing throat. On Christmas "ham" got stuck, he threw up for about an hour then he drank kambucha and ham passed down.He has been found to be possibly hazelnut allergy, not to eggs/diary/meat/soy. He doesn't drink IPA beers. Certain times he will eat meat 6 days a week and feels the need to clear his throat. He was told budesonide would cost him around $88 a week Currently he can swallow anything except for steak, if he has not eaten meat for a week he feels fine, but if he eats meat 7 days in a row, he feels the need to "clear his throat". He feels anxious about food and cuts the food in smaller in pieces.   Current Medications  Taking   Claritin(Loratadine) 10 MG Tablet 1 tablet Orally Once a day   Indomethacin 50 MG Capsule 1 capsule with food or milk Orally twice a day   Protonix(Pantoprazole Sodium) 40 MG Tablet Delayed Release 1 tablet Orally twice a day   Toprol XL(Metoprolol Succinate ER) 100 MG Tablet Extended Release 24 Hour 1 tablet Orally once a day   Norvasc(AmLODIPine Besylate) 5 MG Tablet 1 tablet Orally Once a day   Lisinopril 20 MG Tablet 1 tablet Orally once a day   Allopurinol 100 MG Tablet 1 tablet Orally Once a day   Flovent HFA(Fluticasone Propionate HFA) 220 MCG/ACT Aerosol 1 puff Inhalation Twice a day   Medication List reviewed and reconciled with the patient    Past Medical History  Hypertension.   GERD.    Gout.   eosinophilic esophagitis and rhinitis - Dr. Dellis AnesGallagher - 03/2017.    Surgical History  lumbar laminectomy 2002  cervical diskectomy 2007  endo 01/07/16,05/2016   Family History  Father: alive 4576 yrs, hypertension, diagnosed with Hypertension  Mother: deceased 872 yrs, CHF, CHF (congestive heart failure), Atrial fibrillation  no siblings ,\\n No Family History of Colon Cancer, Polyps, or Liver Disease.   Social History  General:  Tobacco use  cigarettes: Never smoked Tobacco history last updated 01/02/2017 EXPOSURE TO PASSIVE SMOKE: no.  Alcohol: yes, beer 10 weekly, wine 1-2 bottles weekly.  Caffeine: green tea 2 x daily.  no Recreational drug use, none.  Exercise: walk 2 miles 2 x weekly.  Marital Status: Single.  Children: none.  EDUCATION: [employer].  OCCUPATION: Wine sales.    Allergies  N.K.D.A.   Hospitalization/Major Diagnostic Procedure  not in past yr 04/2017   Review of Systems  GI PROCEDURE:  no Pacemaker/ AICD, no. no Artificial heart valves. no MI/heart attack. no Abnormal heart rhythm. no Angina. no CVA. Hypertension YES. no Hypotension. no Asthma, COPD. no Sleep apnea. no Seizure disorders. no Artificial joints. no Severe DJD. no Diabetes. no Significant headaches. no Vertigo. no Depression/anxiety. no Abnormal bleeding. no Kidney Disease. no Liver disease, no. no Blood transfusion.       Vital Signs  Wt 209, Wt change -.5 lb, Ht 71, BMI 29.15, Temp 98.9,  Pulse sitting 78, BP sitting 134/85.   Examination  Gastroenterology:: GENERAL APPEARANCE: Well developed, overweight, no active distress, pleasant, no acute distress.  EYES: Lids and conjunctiva normal. Sclera normal, pupils equal and reactive .  ORAL CAVITY: Lips, teeth and gums are normal. Pharynx, tongue, mucosa normal .  SCLERA: anicteric .  NECK Full ROM, trachea midline, no thyromegaly or masses .  CARDIOVASCULAR PMI LS border. Normal RRR w/o murmers or gallops. No peripheral edema  .  RESPIRATORY Breath sounds normal. Respiration even and unlabored .  ABDOMEN No masses palpated. Liver and spleen not palpated, normal. Bowel sounds normal, Abdomen not distended .  EXTREMITIES: No edema, pulses intact .  NEURO: normal strength and reflexes, cranial nerves II-XII grossly intact, normal gait .  PSYCH: mood/affect normal .     Assessments   1. Eosinophilic esophagitis - K20.0 (Primary)   Treatment  1. Eosinophilic esophagitis  Start Pantoprazole Sodium Tablet Delayed Release, 40 MG, 1 tablet, Orally, twice a day, 90 days, 180 Tablet, Refills 1 IMAGING: Esophagoscopy    Whitfield,Dia 04/21/2017 09:36:42 AM > spoke with Jill-scheduled for 06/10/17-[facility] at 11:30am-prep instructions reviewed with pt.   Notes: Patient wants to try budesonide after he finishes Flovent in January, wants to schedule an endoscopy in March to evaluate the eosinophil burden. Advised patient to continue PPI BID while taking steroids either Flovent or budesonide. Will schedule for an EGD in March to evaluate eosinophil burden and dilate if obvious stricture or narrowing noted.

## 2017-06-10 ENCOUNTER — Encounter (HOSPITAL_COMMUNITY)
Admission: RE | Disposition: A | Discharge status: Home / Self Care | Payer: Self-pay | Source: Ambulatory Visit | Attending: Gastroenterology

## 2017-06-10 ENCOUNTER — Ambulatory Visit (HOSPITAL_COMMUNITY): Payer: BLUE CROSS/BLUE SHIELD | Admitting: Anesthesiology

## 2017-06-10 ENCOUNTER — Ambulatory Visit (HOSPITAL_COMMUNITY)
Admission: RE | Admit: 2017-06-10 | Discharge: 2017-06-10 | Disposition: A | Discharge status: Home / Self Care | Payer: BLUE CROSS/BLUE SHIELD | Source: Ambulatory Visit | Attending: Gastroenterology | Admitting: Gastroenterology

## 2017-06-10 ENCOUNTER — Encounter (HOSPITAL_COMMUNITY): Payer: Self-pay | Admitting: *Deleted

## 2017-06-10 ENCOUNTER — Other Ambulatory Visit: Payer: Self-pay

## 2017-06-10 DIAGNOSIS — R1310 Dysphagia, unspecified: Secondary | ICD-10-CM | POA: Diagnosis not present

## 2017-06-10 DIAGNOSIS — M109 Gout, unspecified: Secondary | ICD-10-CM | POA: Diagnosis not present

## 2017-06-10 DIAGNOSIS — K222 Esophageal obstruction: Secondary | ICD-10-CM | POA: Insufficient documentation

## 2017-06-10 DIAGNOSIS — K200 Eosinophilic esophagitis: Secondary | ICD-10-CM | POA: Diagnosis not present

## 2017-06-10 DIAGNOSIS — Z79899 Other long term (current) drug therapy: Secondary | ICD-10-CM | POA: Diagnosis not present

## 2017-06-10 DIAGNOSIS — I10 Essential (primary) hypertension: Secondary | ICD-10-CM | POA: Insufficient documentation

## 2017-06-10 DIAGNOSIS — K219 Gastro-esophageal reflux disease without esophagitis: Secondary | ICD-10-CM | POA: Diagnosis not present

## 2017-06-10 DIAGNOSIS — K449 Diaphragmatic hernia without obstruction or gangrene: Secondary | ICD-10-CM | POA: Insufficient documentation

## 2017-06-10 DIAGNOSIS — K228 Other specified diseases of esophagus: Secondary | ICD-10-CM | POA: Diagnosis not present

## 2017-06-10 DIAGNOSIS — K3189 Other diseases of stomach and duodenum: Secondary | ICD-10-CM | POA: Diagnosis not present

## 2017-06-10 HISTORY — DX: Gastro-esophageal reflux disease without esophagitis: K21.9

## 2017-06-10 HISTORY — PX: BALLOON DILATION: SHX5330

## 2017-06-10 HISTORY — DX: Essential (primary) hypertension: I10

## 2017-06-10 HISTORY — PX: ESOPHAGOGASTRODUODENOSCOPY (EGD) WITH PROPOFOL: SHX5813

## 2017-06-10 SURGERY — ESOPHAGOGASTRODUODENOSCOPY (EGD) WITH PROPOFOL
Anesthesia: Monitor Anesthesia Care

## 2017-06-10 MED ORDER — LACTATED RINGERS IV SOLN
INTRAVENOUS | Status: DC
  Administered 2017-06-10 (×2): via INTRAVENOUS

## 2017-06-10 MED ORDER — SODIUM CHLORIDE 0.9 % IV SOLN: INTRAVENOUS | Status: DC

## 2017-06-10 MED ORDER — PROPOFOL 10 MG/ML IV BOLUS
INTRAVENOUS | Status: AC
  Filled 2017-06-10: qty 40

## 2017-06-10 MED ORDER — PROPOFOL 10 MG/ML IV BOLUS
INTRAVENOUS | Status: DC | PRN
  Administered 2017-06-10 (×5): 50 mg via INTRAVENOUS

## 2017-06-10 MED ORDER — LIDOCAINE 2% (20 MG/ML) 5 ML SYRINGE
INTRAMUSCULAR | Status: DC | PRN
  Administered 2017-06-10: 60 mg via INTRAVENOUS

## 2017-06-10 NOTE — Transfer of Care (Signed)
Immediate Anesthesia Transfer of Care Note  Patient: Dakota Fields  Procedure(s) Performed: ESOPHAGOGASTRODUODENOSCOPY (EGD) WITH PROPOFOL (N/A ) BALLOON DILATION (N/A )  Patient Location: PACU and Endoscopy Unit  Anesthesia Type:MAC  Level of Consciousness: awake and alert   Airway & Oxygen Therapy: Patient Spontanous Breathing  Post-op Assessment: Report given to RN and Post -op Vital signs reviewed and stable  Post vital signs: Reviewed and stable  Last Vitals:  Vitals:   06/10/17 1005  BP: (!) 169/94  Pulse: 79  Resp: 12  Temp: 36.4 C  SpO2: 99%    Last Pain:  Vitals:   06/10/17 1005  TempSrc: Oral         Complications: No apparent anesthesia complications

## 2017-06-10 NOTE — Interval H&P Note (Signed)
History and Physical Interval Note: 47/male with EOE for EGD and possible dilation and biopsies of esophagus to evaluate response to treatment. 06/10/2017 11:50 AM  Dakota Fields  has presented today for EGD with possible dilation with the diagnosis of Eosinophilic esophagitis  The various methods of treatment have been discussed with the patient and family. After consideration of risks, benefits and other options for treatment, the patient has consented to  Procedure(s): ESOPHAGOGASTRODUODENOSCOPY (EGD) WITH PROPOFOL (N/A) BALLOON DILATION (N/A) as a surgical intervention .  The patient's history has been reviewed, patient examined, no change in status, stable for surgery.  I have reviewed the patient's chart and labs.  Questions were answered to the patient's satisfaction.     Kerin SalenArya Leo Fray

## 2017-06-10 NOTE — Anesthesia Preprocedure Evaluation (Addendum)
Anesthesia Evaluation  Patient identified by MRN, date of birth, ID band Patient awake    Reviewed: Allergy & Precautions, NPO status , Patient's Chart, lab work & pertinent test results  Airway Mallampati: II  TM Distance: >3 FB Neck ROM: Full    Dental no notable dental hx. (+) Dental Advisory Given   Pulmonary neg pulmonary ROS,    Pulmonary exam normal breath sounds clear to auscultation       Cardiovascular hypertension, Pt. on medications and Pt. on home beta blockers Normal cardiovascular exam Rhythm:Regular Rate:Normal     Neuro/Psych negative neurological ROS  negative psych ROS   GI/Hepatic Neg liver ROS, GERD  Medicated,  Endo/Other  negative endocrine ROS  Renal/GU negative Renal ROS  negative genitourinary   Musculoskeletal negative musculoskeletal ROS (+)   Abdominal   Peds negative pediatric ROS (+)  Hematology negative hematology ROS (+)   Anesthesia Other Findings   Reproductive/Obstetrics negative OB ROS                            Anesthesia Physical Anesthesia Plan  ASA: II  Anesthesia Plan: MAC   Post-op Pain Management:    Induction: Intravenous  PONV Risk Score and Plan: 0  Airway Management Planned: Simple Face Mask  Additional Equipment:   Intra-op Plan:   Post-operative Plan:   Informed Consent: I have reviewed the patients History and Physical, chart, labs and discussed the procedure including the risks, benefits and alternatives for the proposed anesthesia with the patient or authorized representative who has indicated his/her understanding and acceptance.   Dental advisory given  Plan Discussed with: CRNA and Surgeon  Anesthesia Plan Comments:         Anesthesia Quick Evaluation

## 2017-06-10 NOTE — Anesthesia Procedure Notes (Signed)
Date/Time: 06/10/2017 11:55 AM Performed by: Minerva EndsMirarchi, Vitor Overbaugh M, CRNA Pre-anesthesia Checklist: Patient identified, Emergency Drugs available, Suction available, Patient being monitored and Timeout performed Patient Re-evaluated:Patient Re-evaluated prior to induction Oxygen Delivery Method: Nasal cannula Placement Confirmation: breath sounds checked- equal and bilateral and positive ETCO2 Dental Injury: Teeth and Oropharynx as per pre-operative assessment

## 2017-06-10 NOTE — Discharge Instructions (Signed)
YOU HAD AN ENDOSCOPIC PROCEDURE TODAY: Refer to the procedure report and other information in the discharge instructions given to you for any specific questions about what was found during the examination. If this information does not answer your questions, please call [facility] office at [phone] to clarify.  ° °YOU SHOULD EXPECT: Some feelings of bloating in the abdomen. Passage of more gas than usual. Walking can help get rid of the air that was put into your GI tract during the procedure and reduce the bloating. If you had a lower endoscopy (such as a colonoscopy or flexible sigmoidoscopy) you may notice spotting of blood in your stool or on the toilet paper. Some abdominal soreness may be present for a day or two, also. ° °DIET: Your first meal following the procedure should be a light meal and then it is ok to progress to your normal diet. A half-sandwich or bowl of soup is an example of a good first meal. Heavy or fried foods are harder to digest and may make you feel nauseous or bloated. Drink plenty of fluids but you should avoid alcoholic beverages for 24 hours. If you had a esophageal dilation, please see attached instructions for diet.  ° °ACTIVITY: Your care partner should take you home directly after the procedure. You should plan to take it easy, moving slowly for the rest of the day. You can resume normal activity the day after the procedure however YOU SHOULD NOT DRIVE, use power tools, machinery or perform tasks that involve climbing or major physical exertion for 24 hours (because of the sedation medicines used during the test).  ° °SYMPTOMS TO REPORT IMMEDIATELY: °A gastroenterologist can be reached at any hour. Please call [phone]  for any of the following symptoms:  °Following lower endoscopy (colonoscopy, flexible sigmoidoscopy) °Excessive amounts of blood in the stool  °Significant tenderness, worsening of abdominal pains  °Swelling of the abdomen that is new, acute  °Fever of 100° or  higher  °Following upper endoscopy (EGD, EUS, ERCP, esophageal dilation) °Vomiting of blood or coffee ground material  °New, significant abdominal pain  °New, significant chest pain or pain under the shoulder blades  °Painful or persistently difficult swallowing  °New shortness of breath  °Black, tarry-looking or red, bloody stools ° °FOLLOW UP:  °If any biopsies were taken you will be contacted by phone or by letter within the next 1-3 weeks. Call [phone]  if you have not heard about the biopsies in 3 weeks.  °Please also call with any specific questions about appointments or follow up tests. °YOU HAD AN ENDOSCOPIC PROCEDURE TODAY: Refer to the procedure report and other information in the discharge instructions given to you for any specific questions about what was found during the examination. If this information does not answer your questions, please call [facility] office at [phone] to clarify.  ° °YOU SHOULD EXPECT: Some feelings of bloating in the abdomen. Passage of more gas than usual. Walking can help get rid of the air that was put into your GI tract during the procedure and reduce the bloating. If you had a lower endoscopy (such as a colonoscopy or flexible sigmoidoscopy) you may notice spotting of blood in your stool or on the toilet paper. Some abdominal soreness may be present for a day or two, also. ° °DIET: Your first meal following the procedure should be a light meal and then it is ok to progress to your normal diet. A half-sandwich or bowl of soup is an example   of a good first meal. Heavy or fried foods are harder to digest and may make you feel nauseous or bloated. Drink plenty of fluids but you should avoid alcoholic beverages for 24 hours. If you had a esophageal dilation, please see attached instructions for diet.  ° °ACTIVITY: Your care partner should take you home directly after the procedure. You should plan to take it easy, moving slowly for the rest of the day. You can resume  normal activity the day after the procedure however YOU SHOULD NOT DRIVE, use power tools, machinery or perform tasks that involve climbing or major physical exertion for 24 hours (because of the sedation medicines used during the test).  ° °SYMPTOMS TO REPORT IMMEDIATELY: °A gastroenterologist can be reached at any hour. Please call [phone]  for any of the following symptoms:  ° °Following upper endoscopy (EGD, EUS, ERCP, esophageal dilation) °Vomiting of blood or coffee ground material  °New, significant abdominal pain  °New, significant chest pain or pain under the shoulder blades  °Painful or persistently difficult swallowing  °New shortness of breath  °Black, tarry-looking or red, bloody stools ° °FOLLOW UP:  °If any biopsies were taken you will be contacted by phone or by letter within the next 1-3 weeks. Call [phone]  if you have not heard about the biopsies in 3 weeks.  °Please also call with any specific questions about appointments or follow up tests. ° °

## 2017-06-10 NOTE — Op Note (Signed)
EGD was performed for dysphagia and to assess response to therapy in a patient with eosinophilic esophagitis.  Findings: Probe created narrowing was noted with a Schatzki's ring in the lower third of the esophagus. Balloon dilatation with 20 mm balloon was performed for 2 minutes. Mild changes of eosinophilic esophagitis characterized by longitudinal markings and white specks was found in the middle and lower third of the esophagus. Biopsies taken to evaluate burden of eosinophils per high-power field. A 4 cm hiatal hernia was noted. Mild erythema noted in the antrum, biopsies taken for H. Pylori. Normal cardia and fundus on retroflexion. Normal-appearing duodenal bulb and rest of the duodenum.   Recommendations: Continue PPI twice a day. Continue budesonide respules. Await pathology report and follow up as an outpatient.   Kerin SalenArya Gradyn Shein, M.D.

## 2017-06-10 NOTE — Op Note (Signed)
[facility] Patient Name: Dakota LacyChadwick Inclan Procedure Date: 06/10/2017 MRN: [id number] Attending MD: Kerin SalenArya Katheline Brendlinger , MD Date of Birth: 1970-10-09 CSN: [id number] Age: [age] Admit Type: Outpatient Procedure:                Upper GI endoscopy Indications:              Dysphagia, Follow-up of eosinophilic esophagitis Providers:                Kerin SalenArya Jowanna Loeffler, MD, Dwain SarnaPatricia Ford, RN, Arlee Muslimhris Chandler                            Tech., Technician, Margo AyeValeria McKoy, Technician, Heron NayAngie                            Mirarchi, CRNA Referring MD:              Medicines:                Monitored Anesthesia Care Complications:            No immediate complications. Estimated Blood Loss:     Estimated blood loss: none. Procedure:                Pre-Anesthesia Assessment:                           - Prior to the procedure, a History and Physical                            was performed, and patient medications and                            allergies were reviewed. The patient's tolerance of                            previous anesthesia was also reviewed. The risks                            and benefits of the procedure and the sedation                            options and risks were discussed with the patient.                            All questions were answered, and informed consent                            was obtained. Prior Anticoagulants: The patient has                            taken no previous anticoagulant or antiplatelet                            agents. ASA Grade Assessment: II - A patient with  mild systemic disease. After reviewing the risks                            and benefits, the patient was deemed in                            satisfactory condition to undergo the procedure.                           After obtaining informed consent, the endoscope was                            passed under direct vision. Throughout the                             procedure, the patient's blood pressure, pulse, and                            oxygen saturations were monitored continuously. The                            ([id number]) [id number] was introduced through the                            mouth, and advanced to the second part of duodenum.                            The upper GI endoscopy was accomplished without                            difficulty. The patient tolerated the procedure                            well. Scope In: Scope Out: Findings:      A low-grade of narrowing Schatzki ring (acquired) was found in the lower       third of the esophagus. A TTS dilator was passed through the scope.       Dilation with an 18-19-20 mm x 8 cm CRE balloon dilator was performed to       20 mm fro 2 consecutive minutes. The dilation site was examined       following endoscope reinsertion and showed complete resolution of       luminal narrowing.      Diffuse mild mucosal changes characterized by longitudinal markings and       white specks were found in the middle third of the esophagus and in the       lower third of the esophagus. Biopsies were obtained from the proximal       and distal esophagus with cold forceps for histology of suspected       eosinophilic esophagitis.      A 4 cm hiatal hernia was present.      Localized mildly erythematous mucosa without bleeding was found in the       gastric antrum. Biopsies were taken with a cold forceps for Helicobacter       pylori testing.  The cardia and gastric fundus were normal on retroflexion.      The examined duodenum was normal. Impression:               - Low-grade of narrowing Schatzki ring. Dilated.                           - Longitudinally marked, white specked mucosa in                            the esophagus. Biopsied.                           - 4 cm hiatal hernia.                           - Erythematous mucosa in the antrum. Biopsied.                           - Normal examined  duodenum. Moderate Sedation:      Patient did not receive moderate sedation for this procedure, but       instead received monitored anesthesia care. Recommendation:           - Patient has a contact number available for                            emergencies. The signs and symptoms of potential                            delayed complications were discussed with the                            patient. Return to normal activities tomorrow.                            Written discharge instructions were provided to the                            patient.                           - Resume regular diet.                           - Continue present medications.                           - Await pathology results. Procedure Code(s):        --- Professional ---                           [phone], Esophagogastroduodenoscopy, flexible,                            transoral; with transendoscopic balloon dilation of                            esophagus (  less than 30 mm diameter)                           43239, Esophagogastroduodenoscopy, flexible,                            transoral; with biopsy, single or multiple Diagnosis Code(s):        --- Professional ---                           K22.2, Esophageal obstruction                           K22.8, Other specified diseases of esophagus                           K31.89, Other diseases of stomach and duodenum                           K44.9, Diaphragmatic hernia without obstruction or                            gangrene                           R13.10, Dysphagia, unspecified                           K20.9, Esophagitis, unspecified CPT copyright 2016 [employer]. All rights reserved. The codes documented in this report are preliminary and upon coder review may  be revised to meet current compliance requirements. Kerin Salen, MD 06/10/2017 12:14:56 PM This report has been signed electronically. Number of Addenda: 0

## 2017-06-10 NOTE — Brief Op Note (Signed)
06/10/2017  12:15 PM  PATIENT:  Dakota Fields  47 y.o. male  PRE-OPERATIVE DIAGNOSIS:  Eosinophilic esophagitis  POST-OPERATIVE DIAGNOSIS:  esophageal dilation/esophageal and gastric bx  PROCEDURE:  Procedure(s): ESOPHAGOGASTRODUODENOSCOPY (EGD) WITH PROPOFOL (N/A) BALLOON DILATION (N/A)  SURGEON:  Surgeon(s) and Role:    Ronnette Juniper, MD - Primary  PHYSICIAN ASSISTANT:   ASSISTANTS: patricia Ford, RN, Valeria McKoy, Tech  ANESTHESIA:   MAC  EBL:  0 mL   BLOOD ADMINISTERED:none  DRAINS: none   LOCAL MEDICATIONS USED:  NONE  SPECIMEN:  Biopsy / Limited Resection  DISPOSITION OF SPECIMEN:  PATHOLOGY  COUNTS:  YES  TOURNIQUET:  * No tourniquets in log *  DICTATION: .Dragon Dictation  PLAN OF CARE: Discharge to home after PACU  PATIENT DISPOSITION:  PACU - hemodynamically stable.   Delay start of Pharmacological VTE agent (>24hrs) due to surgical blood loss or risk of bleeding: no

## 2017-06-10 NOTE — Anesthesia Postprocedure Evaluation (Signed)
Anesthesia Post Note  Patient: Dakota Fields  Procedure(s) Performed: ESOPHAGOGASTRODUODENOSCOPY (EGD) WITH PROPOFOL (N/A ) BALLOON DILATION (N/A )     Patient location during evaluation: PACU Anesthesia Type: MAC Level of consciousness: awake and alert Pain management: pain level controlled Vital Signs Assessment: post-procedure vital signs reviewed and stable Respiratory status: spontaneous breathing, nonlabored ventilation, respiratory function stable and patient connected to nasal cannula oxygen Cardiovascular status: stable and blood pressure returned to baseline Postop Assessment: no apparent nausea or vomiting Anesthetic complications: no    Last Vitals:  Vitals:   06/10/17 1005 06/10/17 1217  BP: (!) 169/94 (!) 125/97  Pulse: 79 80  Resp: 12 19  Temp: 36.4 C 36.4 C  SpO2: 99% 97%    Last Pain:  Vitals:   06/10/17 1217  TempSrc: Oral                 Danissa Rundle S

## 2017-06-12 ENCOUNTER — Encounter (HOSPITAL_COMMUNITY): Payer: Self-pay | Admitting: Gastroenterology

## 2017-06-26 DIAGNOSIS — K200 Eosinophilic esophagitis: Secondary | ICD-10-CM | POA: Diagnosis not present

## 2017-08-14 DIAGNOSIS — E7800 Pure hypercholesterolemia, unspecified: Secondary | ICD-10-CM | POA: Diagnosis not present

## 2017-08-14 DIAGNOSIS — M1A9XX0 Chronic gout, unspecified, without tophus (tophi): Secondary | ICD-10-CM | POA: Diagnosis not present

## 2017-08-14 DIAGNOSIS — I10 Essential (primary) hypertension: Secondary | ICD-10-CM | POA: Diagnosis not present

## 2017-09-30 ENCOUNTER — Encounter: Payer: Self-pay | Admitting: Allergy & Immunology

## 2017-12-16 DIAGNOSIS — K200 Eosinophilic esophagitis: Secondary | ICD-10-CM | POA: Diagnosis not present

## 2018-04-23 DIAGNOSIS — M109 Gout, unspecified: Secondary | ICD-10-CM | POA: Diagnosis not present

## 2018-04-23 DIAGNOSIS — I10 Essential (primary) hypertension: Secondary | ICD-10-CM | POA: Diagnosis not present

## 2018-04-23 DIAGNOSIS — E7800 Pure hypercholesterolemia, unspecified: Secondary | ICD-10-CM | POA: Diagnosis not present

## 2018-04-23 DIAGNOSIS — R748 Abnormal levels of other serum enzymes: Secondary | ICD-10-CM | POA: Diagnosis not present

## 2018-04-23 DIAGNOSIS — K200 Eosinophilic esophagitis: Secondary | ICD-10-CM | POA: Diagnosis not present

## 2018-05-14 DIAGNOSIS — R7989 Other specified abnormal findings of blood chemistry: Secondary | ICD-10-CM | POA: Diagnosis not present

## 2018-11-10 DIAGNOSIS — M1A9XX0 Chronic gout, unspecified, without tophus (tophi): Secondary | ICD-10-CM | POA: Diagnosis not present

## 2018-11-10 DIAGNOSIS — I10 Essential (primary) hypertension: Secondary | ICD-10-CM | POA: Diagnosis not present

## 2018-11-10 DIAGNOSIS — E7800 Pure hypercholesterolemia, unspecified: Secondary | ICD-10-CM | POA: Diagnosis not present

## 2019-02-18 DIAGNOSIS — M109 Gout, unspecified: Secondary | ICD-10-CM | POA: Diagnosis not present

## 2019-03-04 DIAGNOSIS — K200 Eosinophilic esophagitis: Secondary | ICD-10-CM | POA: Diagnosis not present

## 2019-03-04 DIAGNOSIS — M109 Gout, unspecified: Secondary | ICD-10-CM | POA: Diagnosis not present
